# Patient Record
Sex: Male | Born: 1995 | Race: White | Hispanic: No | Marital: Single
Health system: Southern US, Community
[De-identification: ages and names within clinical notes are randomized; demographics above are authoritative.]

---
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
## 2019-03-27 ENCOUNTER — Other Ambulatory Visit: Payer: Self-pay | Admitting: *Deleted

## 2019-03-27 DIAGNOSIS — Z20822 Contact with and (suspected) exposure to covid-19: Secondary | ICD-10-CM

## 2019-03-29 ENCOUNTER — Telehealth: Payer: Self-pay | Admitting: General Practice

## 2019-03-29 LAB — NOVEL CORONAVIRUS, NAA: SARS-CoV-2, NAA: NOT DETECTED

## 2019-03-29 NOTE — Telephone Encounter (Signed)
Negative COVID results given. Patient results "NOT Detected." Caller expressed understanding. ° °

## 2020-08-24 ENCOUNTER — Other Ambulatory Visit: Payer: Self-pay

## 2020-08-24 ENCOUNTER — Ambulatory Visit (INDEPENDENT_AMBULATORY_CARE_PROVIDER_SITE_OTHER): Payer: BC Managed Care – PPO | Admitting: Dermatology

## 2020-08-24 DIAGNOSIS — D492 Neoplasm of unspecified behavior of bone, soft tissue, and skin: Secondary | ICD-10-CM

## 2020-08-24 DIAGNOSIS — D2261 Melanocytic nevi of right upper limb, including shoulder: Secondary | ICD-10-CM

## 2020-08-24 DIAGNOSIS — D485 Neoplasm of uncertain behavior of skin: Secondary | ICD-10-CM | POA: Diagnosis not present

## 2020-08-24 DIAGNOSIS — D2262 Melanocytic nevi of left upper limb, including shoulder: Secondary | ICD-10-CM

## 2020-08-24 DIAGNOSIS — D225 Melanocytic nevi of trunk: Secondary | ICD-10-CM

## 2020-08-24 DIAGNOSIS — D229 Melanocytic nevi, unspecified: Secondary | ICD-10-CM

## 2020-08-24 NOTE — Patient Instructions (Signed)

## 2020-08-24 NOTE — Progress Notes (Signed)
New Patient Visit  Subjective  Angel Gill is a 25 y.o. male who presents for the following: Skin Tag (L axilla, pt interested in removing) and Upper body skin exam (Moles on back/neck pt interested in removing).   The following portions of the chart were reviewed this encounter and updated as appropriate:       Review of Systems:  No other skin or systemic complaints except as noted in HPI or Assessment and Plan.  Objective  Well appearing patient in no apparent distress; mood and affect are within normal limits.  All skin waist up examined.  Objective  Left Upper medial arm: 4.22mm fleshy brown pap     Objective  Left Lower Back: 6.52mm fleshy pap     Objective  L mid back: 6.62mm brown fleshy pap     Objective  Right post shoulder: 6.46mm fleshy pap      Assessment & Plan  Neoplasm of skin (4) Left Upper medial arm  Epidermal / dermal shaving  Lesion diameter (cm):  0.4 Informed consent: discussed and consent obtained   Patient was prepped and draped in usual sterile fashion: area prepped with alcohol. Anesthesia: the lesion was anesthetized in a standard fashion   Anesthetic:  1% lidocaine w/ epinephrine 1-100,000 buffered w/ 8.4% NaHCO3 Instrument used: flexible razor blade   Hemostasis achieved with: pressure, aluminum chloride and electrodesiccation   Outcome: patient tolerated procedure well   Post-procedure details: wound care instructions given   Post-procedure details comment:  Ointment and small bandage applied  Specimen 1 - Surgical pathology Differential Diagnosis: D48.5 Irritated Nevus r/o Atypia  Check Margins: No 4.75mm fleshy brown pap  Left Lower Back  Epidermal / dermal shaving  Lesion diameter (cm):  0.6 Informed consent: discussed and consent obtained   Patient was prepped and draped in usual sterile fashion: area prepped with alcohol. Anesthesia: the lesion was anesthetized in a standard fashion   Anesthetic:  1%  lidocaine w/ epinephrine 1-100,000 buffered w/ 8.4% NaHCO3 Instrument used: flexible razor blade   Hemostasis achieved with: pressure, aluminum chloride and electrodesiccation   Outcome: patient tolerated procedure well   Post-procedure details: wound care instructions given   Post-procedure details comment:  Ointment and small bandage applied  Specimen 2 - Surgical pathology Differential Diagnosis: D48.5 Irritated Nevus r/o Atypia  Check Margins: No 6.45mm fleshy pap  L mid back  Epidermal / dermal shaving  Lesion diameter (cm):  0.6 Informed consent: discussed and consent obtained   Patient was prepped and draped in usual sterile fashion: area prepped with alcohol. Anesthesia: the lesion was anesthetized in a standard fashion   Anesthetic:  1% lidocaine w/ epinephrine 1-100,000 buffered w/ 8.4% NaHCO3 Instrument used: flexible razor blade   Hemostasis achieved with: pressure, aluminum chloride and electrodesiccation   Outcome: patient tolerated procedure well   Post-procedure details: wound care instructions given   Post-procedure details comment:  Ointment and small bandage applied  Specimen 3 - Surgical pathology Differential Diagnosis: D48.5 Irritated Nevus r/o Atypia  Check Margins: No 6.11mm brown fleshy pap  Right post shoulder  Epidermal / dermal shaving  Informed consent: discussed and consent obtained   Patient was prepped and draped in usual sterile fashion: area prepped with alcohol. Anesthesia: the lesion was anesthetized in a standard fashion   Anesthetic:  1% lidocaine w/ epinephrine 1-100,000 buffered w/ 8.4% NaHCO3 Instrument used: flexible razor blade   Hemostasis achieved with: pressure, aluminum chloride and electrodesiccation   Outcome: patient tolerated procedure well  Post-procedure details: wound care instructions given   Post-procedure details comment:  Ointment and small bandage applied  Specimen 4 - Surgical pathology Differential Diagnosis:  D48.5 Irritated Nevus r/o Atypia  Check Margins: No 6.35mm fleshy pap  Discussed resulting small scar with shave removal, and possible recurrence of lesion.  Recommend vaseline ointment to area daily and cover until healed.  Recommend photoprotection/sunscreen to area to prevent discoloration of scar.  Once healed, may apply OTC Serica scar gel bid to thickened scars.    Melanocytic Nevi - Tan-brown and/or pink-flesh-colored symmetric macules and papules - Benign appearing on exam today - Observation - Call clinic for new or changing moles - Recommend daily use of broad spectrum spf 30+ sunscreen to sun-exposed areas.    No follow-ups on file.  I, Othelia Pulling, RMA, am acting as scribe for Brendolyn Patty, MD .  Documentation: I have reviewed the above documentation for accuracy and completeness, and I agree with the above.  Brendolyn Patty MD

## 2020-08-31 ENCOUNTER — Telehealth: Payer: Self-pay

## 2020-08-31 NOTE — Telephone Encounter (Signed)
-----   Message from Brendolyn Patty, MD sent at 08/31/2020 11:44 AM EDT ----- 1. Skin , left upper medial arm MELANOCYTIC NEVUS, COMPOUND TYPE, IRRITATED 2. Skin , left lower back FIBROLIPOMA, IRRITATED 3. Skin , left mid back MELANOCYTIC NEVUS, COMPOUND TYPE, IRRITATED 4. Skin , right post shoulder MELANOCYTIC NEVUS, COMPOUND TYPE, IRRITATED  1, 3, 4 all benign irritated nevi 2. 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

## 2020-08-31 NOTE — Telephone Encounter (Signed)
Discussed biopsy results with pt  °
